# Patient Record
Sex: Male | Born: 1950 | Race: Black or African American | Hispanic: No | Marital: Married
Health system: Southern US, Community
[De-identification: ages and names within clinical notes are randomized; demographics above are authoritative.]

---

## 2017-04-16 ENCOUNTER — Other Ambulatory Visit: Payer: Self-pay | Admitting: Neurological Surgery

## 2017-04-16 DIAGNOSIS — M48062 Spinal stenosis, lumbar region with neurogenic claudication: Secondary | ICD-10-CM

## 2017-04-16 DIAGNOSIS — M5414 Radiculopathy, thoracic region: Secondary | ICD-10-CM

## 2017-04-30 ENCOUNTER — Ambulatory Visit
Admission: RE | Admit: 2017-04-30 | Discharge: 2017-04-30 | Disposition: A | Discharge status: Home / Self Care | Payer: Medicare Other | Source: Ambulatory Visit | Attending: Neurological Surgery | Admitting: Neurological Surgery

## 2017-04-30 ENCOUNTER — Ambulatory Visit
Admission: RE | Admit: 2017-04-30 | Discharge: 2017-04-30 | Disposition: A | Discharge status: Home / Self Care | Payer: Self-pay | Source: Ambulatory Visit | Attending: Neurological Surgery | Admitting: Neurological Surgery

## 2017-04-30 DIAGNOSIS — M5414 Radiculopathy, thoracic region: Secondary | ICD-10-CM

## 2017-04-30 DIAGNOSIS — M48062 Spinal stenosis, lumbar region with neurogenic claudication: Secondary | ICD-10-CM

## 2017-04-30 MED ORDER — DIAZEPAM 5 MG PO TABS
5.0000 mg | ORAL_TABLET | Freq: Once | ORAL | Status: AC
  Administered 2017-04-30: 10 mg via ORAL

## 2017-04-30 MED ORDER — IOPAMIDOL (ISOVUE-M 300) INJECTION 61%
10.0000 mL | Freq: Once | INTRAMUSCULAR | Status: AC | PRN
  Administered 2017-04-30: 10 mL via INTRATHECAL

## 2017-04-30 MED ORDER — ONDANSETRON HCL 4 MG/2ML IJ SOLN
4.0000 mg | Freq: Once | INTRAMUSCULAR | Status: AC
  Administered 2017-04-30: 4 mg via INTRAMUSCULAR

## 2017-04-30 MED ORDER — MEPERIDINE HCL 100 MG/ML IJ SOLN
75.0000 mg | Freq: Once | INTRAMUSCULAR | Status: AC
  Administered 2017-04-30: 75 mg via INTRAMUSCULAR

## 2017-04-30 NOTE — Discharge Instructions (Signed)

## 2018-06-02 IMAGING — CT CT T SPINE W/ CM
1 series · 12 of 14 positions shown, 15 images · non-contrast
Comparison: none

CLINICAL DATA: Mid and low back pain extending around the left
flank to the left ribs. Patient denies significant lumbar
radiculopathy.
TECHNIQUE: Contiguous axial images were obtained through the Thoracic and
Lumbar spine after the intrathecal infusion of infusion. Coronal and
sagittal reconstructions were obtained of the axial image sets.

[Series 3: t spine soft · axial · 0.36mm/px · z∈[-410,-95]mm · 12 of 125 slices shown, 15 images]
[im 10/125  soft-tissue]
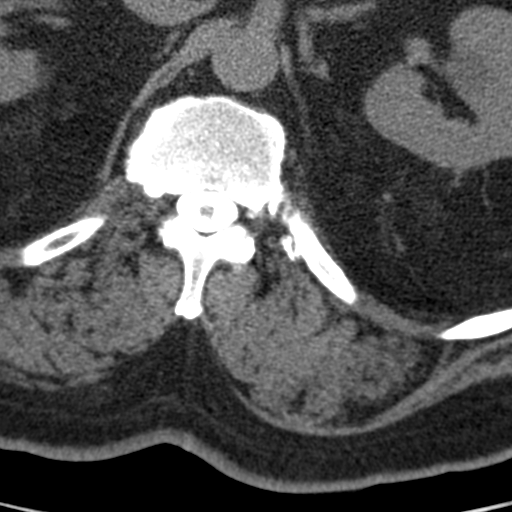
[im 10/125  bone]
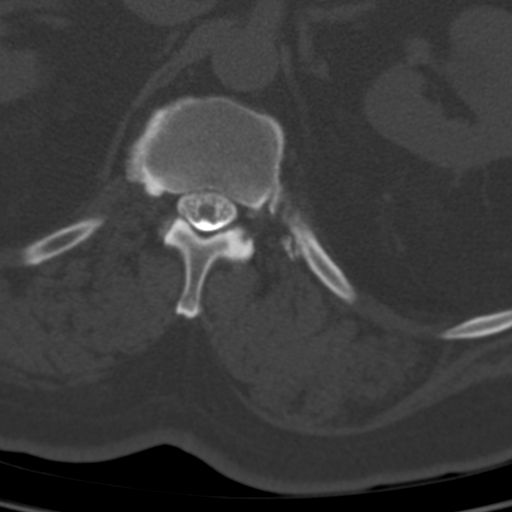
[im 20/125  bone]
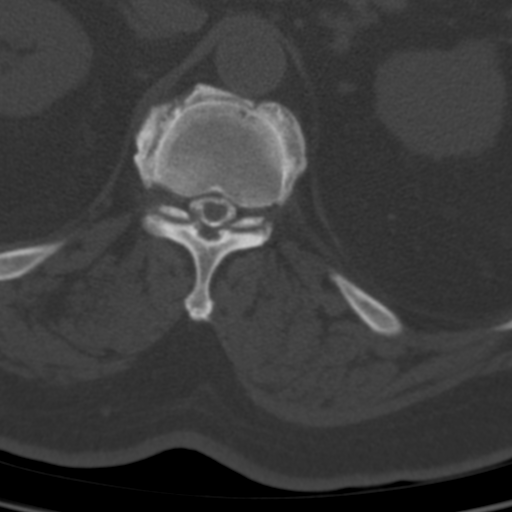
[im 29/125  bone]
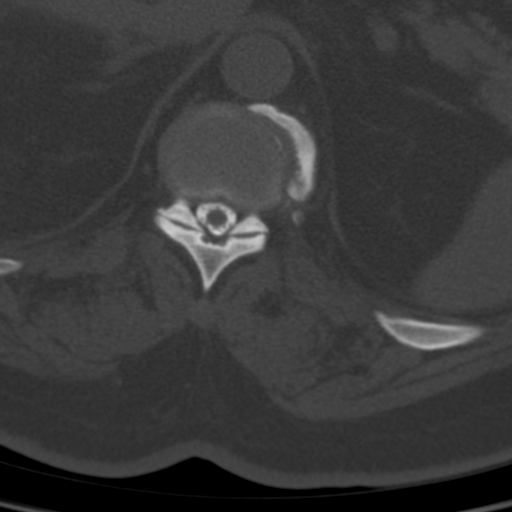
[im 39/125  bone]
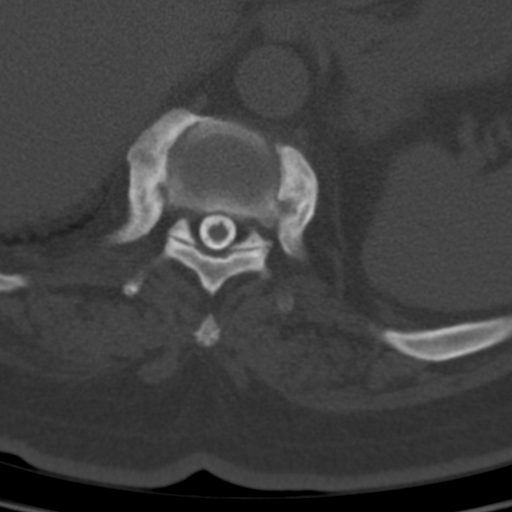
[im 48/125  soft-tissue]
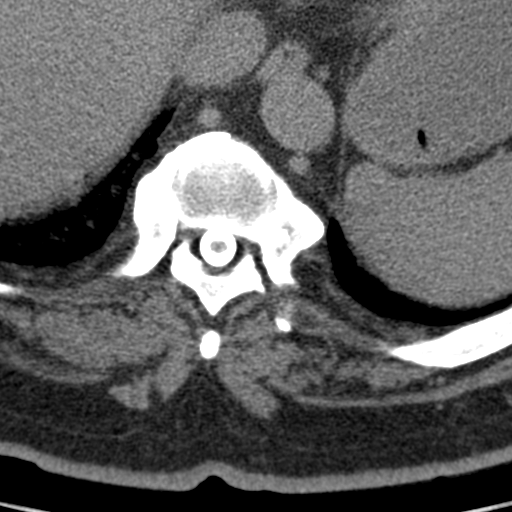
[im 48/125  bone]
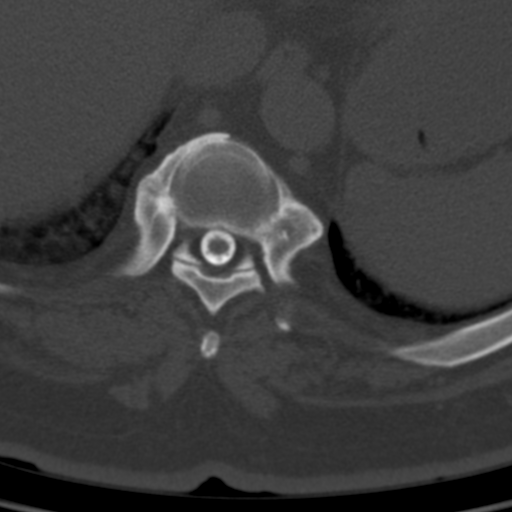
[im 58/125  bone]
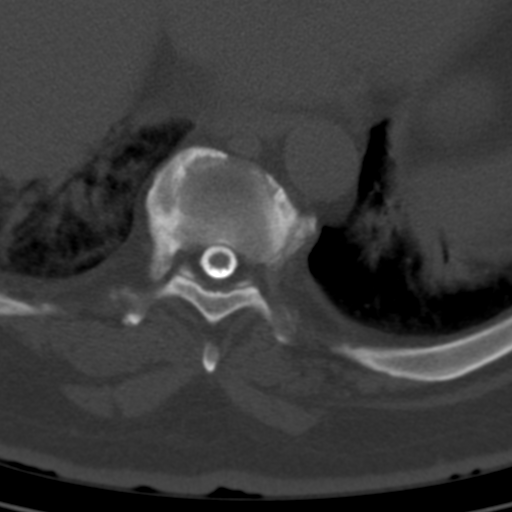
[im 67/125  bone]
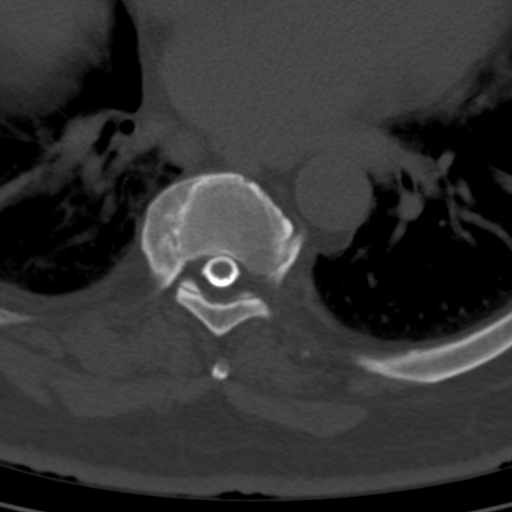
[im 77/125  bone]
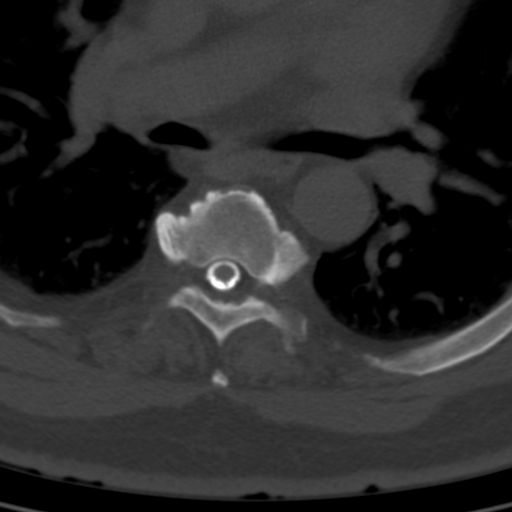
[im 86/125  soft-tissue]
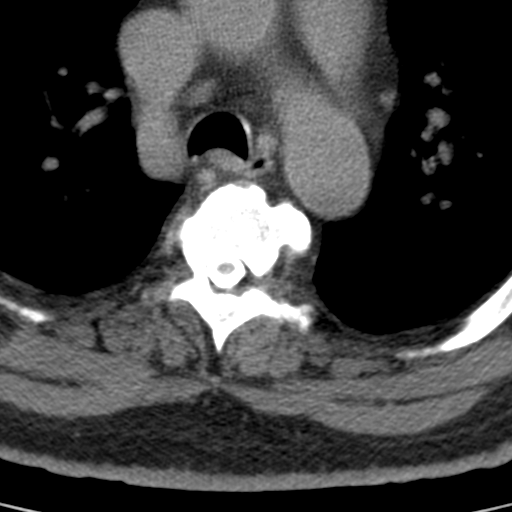
[im 86/125  bone]
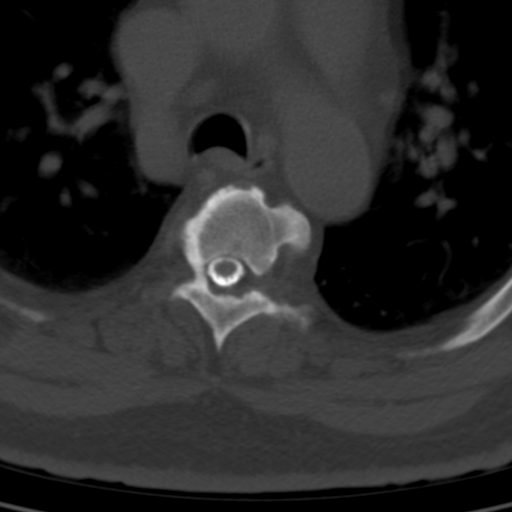
[im 96/125  bone]
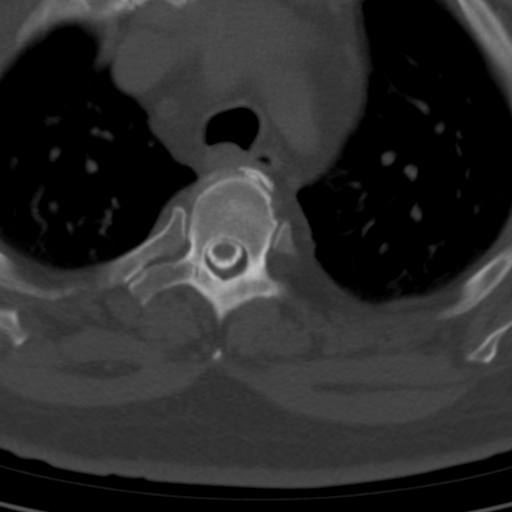
[im 105/125  bone]
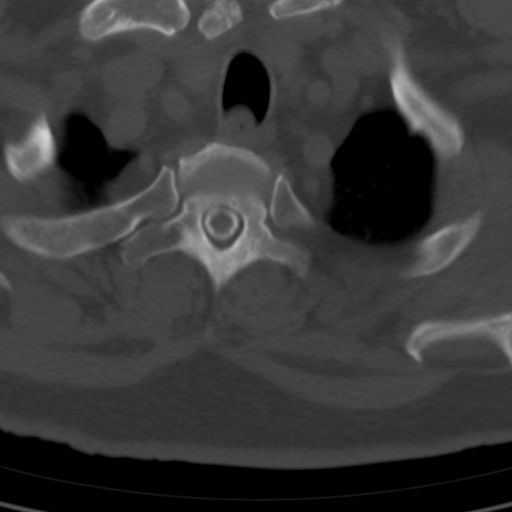
[im 115/125  bone]
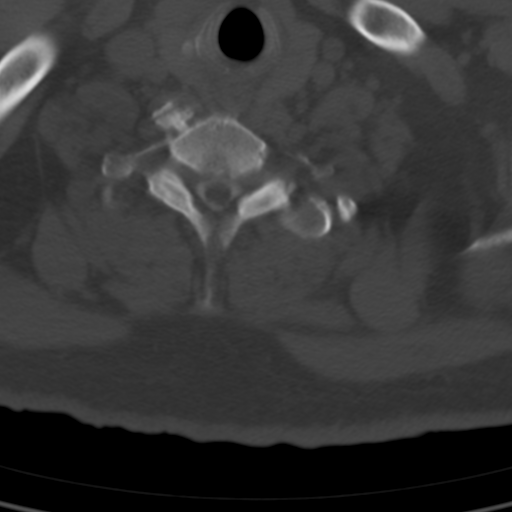

[12 of 14 positions shown; findings below may reference images not displayed]

FLUOROSCOPY TIME:  Radiation Exposure Index (as provided by the
fluoroscopic device): 1111.50 uGy*m2

Fluoroscopy Time:  2 minutes 55 seconds

Number of Acquired Images:  26

PROCEDURE:
LUMBAR PUNCTURE FOR THORACIC AND LUMBAR MYELOGRAM

After thorough discussion of risks and benefits of the procedure
including bleeding, infection, injury to nerves, blood vessels,
adjacent structures as well as headache and CSF leak, written and
oral informed consent was obtained. Consent was obtained by Dr.
Edgar Marcelo Yb.

Patient was positioned prone on the fluoroscopy table. Local
anesthesia was provided with 1% lidocaine without epinephrine after
prepped and draped in the usual sterile fashion. Puncture was
performed at L3-4 using a 3 1/2 inch 22-gauge spinal needle via
midline approach. Initial injection was at L2-3. This was a mixed
injection. The needle was therefore removed. The patient described
pain extending into the scrotum. I then prepped the L3-4 level and
was successful in treating the thecal sac. Using a single pass
through the dura, the needle was placed within the thecal sac, with
return of clear CSF. 10 mL of Isovue M 300 was injected into the
thecal sac, with normal opacification of the nerve roots and cauda
equina consistent with free flow within the subarachnoid space. The
patient was then moved to the trendelenburg position and contrast
flowed into the Thoracic spine region.

I personally performed the lumbar puncture and administered the
intrathecal contrast. I also personally supervised acquisition of
the myelogram images.
FINDINGS: THORACIC AND LUMBAR MYELOGRAM FINDINGS:

Five non rib-bearing lumbar type vertebral bodies are present. Grade
1 anterolisthesis is present at L3-4. This does not change
significantly standing, flexion, or extension. Anterior osteophytes
are fused at this level. AP alignment is otherwise anatomic. There
straightening of the normal thoracic and lumbar curvature.

There is uncovering of a broad-based disc protrusion at L3-4 with
subarticular and foraminal narrowing. Mild right subarticular
narrowing is present. A more prominent disc protrusion is evident at
L2-3 with moderate to severe central canal stenosis. All moderate
central canal narrowing is present at L1-2, worse on the right.
Nerve root tortuosity of the cauda equina nerve T12-L1 suggests a
significant stenosis at L1-2 and L2-3.

Alignment is anatomic in the thoracic spine. The disc protrusion is
present at T12-L1 with mild central canal narrowing. No other focal
disc disease is evident. No significant focal stenosis is present.
Please see CT for details.

CT THORACIC MYELOGRAM FINDINGS:

Twelve rib-bearing thoracic type vertebral bodies are present. All
anterior osteophytes are fused the T2-3 through T11. There is slight
leftward curvature in the upper thoracic spine.

The paraspinous soft tissues are unremarkable. Bibasilar airspace
disease is more prominent on the right.

Go a shallow disc protrusion is present at T2-3. Facet hypertrophy
contributes to mild foraminal narrowing bilaterally at this level.

Facet hypertrophy contributes to moderate left and mild right
foraminal stenosis at T1-2. The central canal is patent.

A central disc protrusion partially effaces the ventral CSF at
T11-12. Moderate foraminal stenosis is secondary to facet
hypertrophy. Foraminal stenosis is worse on the left.

No other focal stenosis is present in the thoracic spine.

CT LUMBAR MYELOGRAM FINDINGS:

Five non rib-bearing lumbar type vertebral bodies are present. Grade
1 anterolisthesis is present at L3-4, measuring 5 mm. Anterior
osteophytes are fused at the L3-4 level. The vertebral body heights
are maintained. Alignment is otherwise anatomic.

Atherosclerotic calcifications are present within the abdominal
aorta and branch vessels without aneurysm. Limited imaging of the
abdomen is otherwise unremarkable.

T12-L1:  The central canal and foramina are patent.

L1-2: A broad-based disc protrusion is present. Advanced facet
hypertrophy is worse on the left. There is a vacuum phenomena in the
disc space and in the left facet joint. This results in moderate
central canal stenosis. Moderate foraminal narrowing is worse on the
left.

L2-3: A broad-based disc protrusion is again noted. Severe central
canal stenosis is present. Facet hypertrophy contributes
bilaterally. Moderate left and mild right foraminal stenosis is
present.

L3-4: There is uncovering of a broad-based disc protrusion. Advanced
facet hypertrophy is noted bilaterally. Moderate subarticular
narrowing is worse on the right. Moderate osseous foraminal
narrowing is worse on the right.

L4-5: No significant focal disc protrusion or stenosis is present.
Posterior elements are fused bilaterally.

L5-S1: There is fusion across the facet joints and endplates. No
focal stenosis is present.
IMPRESSION: 1. Severe central canal stenosis at L2-3 with moderate left and mild
right foraminal narrowing secondary broad-based disc protrusion and
advanced facet hypertrophy.
2. Moderate central and bilateral foraminal stenosis at L1-2
secondary to a broad-based disc protrusion and advanced facet
hypertrophy is worse on the left.
3. Grade 1 anterolisthesis at L3-4 with uncovering number
broad-based disc protrusion and moderate subarticular narrowing,
worse on the right. This does not change significantly with
standing, flexion, or extension there is anterior and posterior
fusion.
4. Moderate osseous foraminal narrowing bilaterally at L3-4 is worse
on the right
5. Anterior posterior fusion compatible with ankylosing spondylitis
at L4-5 and L5-S1 without significant stenosis at these levels.
6. Mild central and moderate bilateral foraminal stenosis at T11-12
secondary to a broad-based disc protrusion and facet hypertrophy.
7. Facet hypertrophy contributes to bilateral foraminal narrowing at
T1-2 and T2-3.

## 2022-06-27 DEATH — deceased
